# Patient Record
Sex: Male | Born: 1990 | Hispanic: Yes | Marital: Married
Health system: Southern US, Community
[De-identification: ages and names within clinical notes are randomized; demographics above are authoritative.]

---

## 2014-01-06 ENCOUNTER — Emergency Department (HOSPITAL_COMMUNITY): Payer: No Typology Code available for payment source

## 2014-01-06 ENCOUNTER — Emergency Department (HOSPITAL_COMMUNITY)
Admission: EM | Admit: 2014-01-06 | Discharge: 2014-01-06 | Disposition: A | Discharge status: Home / Self Care | Payer: No Typology Code available for payment source | Attending: Emergency Medicine | Admitting: Emergency Medicine

## 2014-01-06 ENCOUNTER — Encounter (HOSPITAL_COMMUNITY): Payer: Self-pay | Admitting: Emergency Medicine

## 2014-01-06 DIAGNOSIS — S199XXA Unspecified injury of neck, initial encounter: Secondary | ICD-10-CM | POA: Insufficient documentation

## 2014-01-06 DIAGNOSIS — S0990XA Unspecified injury of head, initial encounter: Secondary | ICD-10-CM | POA: Diagnosis present

## 2014-01-06 DIAGNOSIS — Z23 Encounter for immunization: Secondary | ICD-10-CM | POA: Insufficient documentation

## 2014-01-06 DIAGNOSIS — Y9389 Activity, other specified: Secondary | ICD-10-CM | POA: Diagnosis not present

## 2014-01-06 DIAGNOSIS — Y92410 Unspecified street and highway as the place of occurrence of the external cause: Secondary | ICD-10-CM | POA: Insufficient documentation

## 2014-01-06 DIAGNOSIS — S0101XA Laceration without foreign body of scalp, initial encounter: Secondary | ICD-10-CM | POA: Insufficient documentation

## 2014-01-06 DIAGNOSIS — S3991XA Unspecified injury of abdomen, initial encounter: Secondary | ICD-10-CM | POA: Insufficient documentation

## 2014-01-06 DIAGNOSIS — S299XXA Unspecified injury of thorax, initial encounter: Secondary | ICD-10-CM | POA: Insufficient documentation

## 2014-01-06 DIAGNOSIS — S0191XA Laceration without foreign body of unspecified part of head, initial encounter: Secondary | ICD-10-CM

## 2014-01-06 LAB — I-STAT CHEM 8, ED
BUN: 12 mg/dL (ref 6–23)
Calcium, Ion: 1.23 mmol/L (ref 1.12–1.23)
Chloride: 104 mEq/L (ref 96–112)
Creatinine, Ser: 0.7 mg/dL (ref 0.50–1.35)
Glucose, Bld: 105 mg/dL — ABNORMAL HIGH (ref 70–99)
HEMATOCRIT: 47 % (ref 39.0–52.0)
HEMOGLOBIN: 16 g/dL (ref 13.0–17.0)
Potassium: 3.9 mEq/L (ref 3.7–5.3)
Sodium: 141 mEq/L (ref 137–147)
TCO2: 25 mmol/L (ref 0–100)

## 2014-01-06 MED ORDER — IOHEXOL 300 MG/ML  SOLN
100.0000 mL | Freq: Once | INTRAMUSCULAR | Status: AC | PRN
  Administered 2014-01-06: 100 mL via INTRAVENOUS

## 2014-01-06 MED ORDER — TETANUS-DIPHTH-ACELL PERTUSSIS 5-2.5-18.5 LF-MCG/0.5 IM SUSP
0.5000 mL | Freq: Once | INTRAMUSCULAR | Status: AC
  Administered 2014-01-06: 0.5 mL via INTRAMUSCULAR
  Filled 2014-01-06: qty 0.5

## 2014-01-06 MED ORDER — SODIUM CHLORIDE 0.9 % IV SOLN
INTRAVENOUS | Status: DC
  Administered 2014-01-06: 09:00:00 via INTRAVENOUS

## 2014-01-06 MED ORDER — HYDROCODONE-ACETAMINOPHEN 5-325 MG PO TABS: 1.0000 | ORAL_TABLET | ORAL | Status: AC | PRN

## 2014-01-06 MED ORDER — ONDANSETRON HCL 4 MG PO TABS: 4.0000 mg | ORAL_TABLET | Freq: Four times a day (QID) | ORAL | Status: AC

## 2014-01-06 MED ORDER — MORPHINE SULFATE 4 MG/ML IJ SOLN
4.0000 mg | Freq: Once | INTRAMUSCULAR | Status: AC
  Administered 2014-01-06: 4 mg via INTRAVENOUS
  Filled 2014-01-06: qty 1

## 2014-01-06 MED ORDER — ONDANSETRON HCL 4 MG/2ML IJ SOLN
4.0000 mg | Freq: Once | INTRAMUSCULAR | Status: AC
  Administered 2014-01-06: 4 mg via INTRAVENOUS
  Filled 2014-01-06: qty 2

## 2014-01-06 NOTE — ED Notes (Signed)
Ambulated Patient up and down hallway.  Patient did well.

## 2014-01-06 NOTE — ED Notes (Addendum)
c-collar removed per Tysinger, PA-C.  C-spine cleared.

## 2014-01-06 NOTE — ED Notes (Signed)
Patient to ct at this time
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
## 2014-01-06 NOTE — Discharge Instructions (Signed)
Please follow the directions provided.  You may take ibuprofen 400 mg by mouth every 6 hours. You may take vicodin for pain not relieved by the ibuprofen. Do not take extra tylenol, and do not drive while taking vicodin.  Be sure to follow-up in 2 days with a primary care provider or you may come back to the emergency department for a re-check of your head wound.  The staples will need to be taken out in appr 7-10 days.  Do not hesitate to return for new, worsening or concerning symptoms.  WHEN SHOULD I SEEK IMMEDIATE MEDICAL CARE?  You should get help right away if:  You have confusion or drowsiness.  You feel sick to your stomach (nauseous) or have continued, forceful vomiting.  You have dizziness or unsteadiness that is getting worse.  You have severe, continued headaches not relieved by medicine. Only take over-the-counter or prescription medicines for pain, fever, or discomfort as directed by your health care provider.  You do not have normal function of the arms or legs or are unable to walk.  You notice changes in the black spots in the center of the colored part of your eye (pupil).  You have a clear or bloody fluid coming from your nose or ears.  You have a loss of vision. During the next 24 hours after the injury, you must stay with someone who can watch you for the warning signs. This person should contact local emergency services (911 in the [place] if you have seizures, you become unconscious, or you are unable to wake up.  SEEK IMMEDIATE MEDICAL CARE IF:  You have redness, swelling, or increasing pain in the wound.  You have pus coming from the wound.  You have a fever.  You notice a bad smell coming from the wound or dressing.  Your wound edges break open after staples have been removed.   Emergency Department Resource Guide 1) Find a Doctor and Pay Out of Pocket Although you won't have to find out who is covered by your insurance plan, it is a good idea to ask around and get  recommendations. You will then need to call the office and see if the doctor you have chosen will accept you as a new patient and what types of options they offer for patients who are self-pay. Some doctors offer discounts or will set up payment plans for their patients who do not have insurance, but you will need to ask so you aren't surprised when you get to your appointment.  2) Contact Your Local Health Department Not all health departments have doctors that can see patients for sick visits, but many do, so it is worth a call to see if yours does. If you don't know where your local health department is, you can check in your phone book. The CDC also has a tool to help you locate your state's health department, and many state websites also have listings of all of their local health departments.  3) Find a Walk-in Clinic If your illness is not likely to be very severe or complicated, you may want to try a walk in clinic. These are popping up all over the country in pharmacies, drugstores, and shopping centers. They're usually staffed by nurse practitioners or physician assistants that have been trained to treat common illnesses and complaints. They're usually fairly quick and inexpensive. However, if you have serious medical issues or chronic medical problems, these are probably not your best option.  No Primary Care  Doctor: - Call Health Connect at  [phone] - they can help you locate a primary care doctor that  accepts your insurance, provides certain services, etc. - Physician Referral Service- [phone]  Chronic Pain Problems: Organization         Address  Phone   Notes  [facility]  [phone] Patients need to be referred by their primary care doctor.   Medication Assistance: Organization         Address  Phone   Notes  [facility] Med[facility] [address] [place] [place], [place] [address] [phone] --Must be a resident of  [facility] -- Must have NO insurance coverage whatsoever (no Medicaid/ Medicare, etc.) -- The pt. MUST have a primary care doctor that directs their care regularly and follows them in the community   [facility]  [phone]   [employer]  [phone]    Agencies that provide inexpensive medical care: Organization         Address  Phone   Notes  [facility]  [phone]   [facility]    [phone]   [facility] [address] [place], [place] [address] [phone]   [facility] [address] [place] [place] [phone]   Planned Parenthood    [phone]   [facility]    [phone]   [facility]  [address], [place] Phone:  [phone], Fax:  [phone] Hours of Operation:  9 am - 6 pm, M-F.  Also accepts Medicaid/Medicare and self-pay.  [facility]  [address], [place] Phone: [phone], Fax: [phone]. Hours of Operation:  8:30 am - 5:30 pm, M-F.  Also accepts Medicaid and self-pay.  [facility] [place] [address] [place] Phone: [phone]   [facility] [address] Natasha Bence [place], [place] [phone], Ext. [phone] Mondays & Thursdays: 7-9 AM.  First 15 patients are seen on a first come, first serve basis.    Medicaid-accepting [facility] Providers:  Organization         Address  Phone   Notes  [facility] [address], [place] [phone] Also accepts self-pay patients.  [facility] [address] Laurell Josephs [place], [place]  [phone]   [facility] [address], [place] [phone]   [facility] [address] [place] [phone]   Renaye Rakers [address], [place]   [phone] Only accepts [place] Access  [place] patients after they have their name applied to their card.   Self-Pay (no insurance) in [facility]:  Organization         Address  Phone   Notes  Sickle Cell Patients, [facility] [address] [place], [place] [phone]   [facility]gent Care [address] [place], [place] [phone]   [facility] [place]  [address], [place] [phone]   [facility]/Dr. Osei-Bonsu  [address] [place] or [address], [place] [phone] Phone number for both [place] and [place] locations is the same.  [facility] [address], [place]in[place] Otto [phone]   [facility] [place] [address] [place] [address], [place] or [address] [phone]   [facility] [address], [place] [phone], phone; [phone], fax Sees patients 1st and 3rd Saturday of every month.  Must not qualify for public or private insurance (i.e. Medicaid, Medicare, [place] Health Choice, Veterans' Benefits)  Household income should be no more than 200% of the poverty level The clinic cannot treat you if you are pregnant or think you are pregnant  Sexually transmitted diseases are not treated at the clinic.    Dental Care: Organization         Address  Phone  Notes  [facility] [address], [place] [phone] Accepts children up to age 23 who are enrolled in [place] or [place] Health Choice; pregnant women with a Medicaid card; and children who have applied for Medicaid or [place] Health Choice, but were declined, whose parents can pay a reduced fee at time of service.  [facility] [place]  [address], [place] [phone] Accepts children up to age 23 who are enrolled in [place] or [place] Health Choice; pregnant women with a Medicaid  card; and children who have applied for Medicaid or [place] Health Choice, but were declined, whose parents can pay a reduced fee at time of service.  [facility]  [address], [place] [phone] Patients are seen by appointment only. Walk-ins are not accepted. [facility] will see patients 23 years of age and older. Monday - Tuesday (8am-5pm) Most Wednesdays (8:30-5pm) $30 per visit, cash only  [facility]  [address], [facility] [place] [phone] Patients are seen by appointment only. Walk-ins are not accepted. [facility] will see patients 23 years of age and older. One Wednesday Evening (Monthly: Volunteer Based).  $30 per visit, cash only  [employer] [facility]  [phone] for adults; Children under age 844, call Graduate Pediatric Dentistry at [phone]. Children aged 774-14, please call [phone] to request a pediatric application.  Dental services are provided in all areas of dental care including fillings, crowns and bridges, complete and partial dentures, implants, gum treatment, root canals, and extractions. Preventive care is also provided. Treatment is provided to both adults and children. Patients are selected via a lottery and there is often a waiting list.   [facility] [address], [place]  [phone] [email or web address]   [facility] [address], [place], [place] [phone], Ext. [phone] Second and Fourth Thursday of each month, opens at 6:30 AM; Clinic ends at 9 AM.  Patients are seen on a first-come first-served basis, and a limited number are seen during each clinic.   [facility]  [address], [place], [place] [phone]   Eligibility Requirements You must have lived in [place], [place], or [place] counties for at least the last three months.   You cannot be eligible for state or federal sponsored [place] Cityhealthcare insurance,  including CIGNAVeterans Administration, [place]icaid, or [employer]icare.   You generally cannot be eligible for healthcare insurance through your employer.    How to apply: Eligibility screenings are held every Tuesday and Wednesday afternoon from 1:00 pm until 4:00 pm. You do not need an appointment for the interview!  [facility] [address], [place], [place] [phone]   [place]  [employer] Department  [phone]   [facility] Health Department  [phone]   [facility] Health Department  [phone]    Behavioral Health Resources in the Community: Intensive Outpatient Programs Organization         Address  Phone  Notes  [facility] [address], [place], [place] [phone]   [facility] Outpatient [address] [place], [place] [phone]   [facility] [address], [place], [place]  [phone]   [facility] [address],  [place], [place] [phone] or [phone]   Substance Abuse Resources Organization         Address  Phone  Notes  [facility] Drug [facility]  [phone]   [facility]  [phone]   [facility] [place]  [phone]   Floydene Flock  [phone]   Residential & Outpatient Substance Abuse Program  [phone]   Psychological Services Organization         Address  Phone  Notes  [facility] Health  [phone]   [facility]  [phone]   [facility] [address], [place] [phone] or [phone]    Mobile Crisis Teams Organization         Address  Phone  Notes  [facility], Mobile Crisis Care Unit  [phone]   [facility]  [address] [place], [place] [phone]   Doristine Locks [address] [place] [phone]    Self-Help/Support Groups Organization         Address  Phone             Notes  [facility]oc[facility]  of [place] - variety of support groups  [phone]- I7437963 Call for more information  Narcotics Anonymous (NA), [facility] [address], [employer] [place]  2 meetings at this location   Statistician         Address  Phone  Notes  [facility] [address],    [place]  [phone]   [facility]  [address] [place] [address], [place], [place] [phone]   [facility] [address] [place], [place] [phone] Admissions: 8am-3pm M-F  [facility] [address]    [place], [place] [phone]   [facility] [address] [place], [place], [place] [phone]   [facility] [address] [place] [address]  [place], [place] [phone]   [facility] - Intensive Outpatient [address] Laurell Josephs [address], [place], [place] [phone]   [facility] ([facility] [address] [place].,  [place], [place] [phone] or [phone]   [facility] ([facility]) [address] [place], [place] [phone] Accepts Medicaid  [facility] [address],  [place] [phone] Substance Abuse/Addiction Treatment   [facility] Organization         Address  Phone  Notes  [facility]  [phone]   Angie Fava, PhD [address] Ervin Knack [place], [place]   6474298708 or [phone]   [facility] Behavioral   [address] [place], [place] [phone]   [facility] [address], [place], [place] [phone] Insurance/Medicaid/sponsorship through [employer] and Families [address]  [place], [place] [phone] [place], [place] [phone]    Dr. Adele Schilder  [phone]   [facility] Dept. 1) [address], [place] 2)  [place] 3)  [address] [address], [place] [phone]  [phone]   [place] [phone] or [phone] (After Hours)

## 2014-01-06 NOTE — ED Notes (Signed)
Patient eating sandwich.   Will discharge after eating.

## 2014-01-06 NOTE — ED Notes (Signed)
c-collar placed on patient, patient with c/o upper c-spine tenderness,  Patient explained importance of device post application

## 2014-01-13 NOTE — ED Provider Notes (Signed)
Medical screening examination/treatment/procedure(s) were performed by non-physician practitioner and as supervising physician I was immediately available for consultation/collaboration.   EKG Interpretation None        Jazen Spraggins, DO 01/13/14 1307 
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
